# Patient Record
Sex: Male | Born: 1955 | Race: Black or African American | Hispanic: No | Marital: Single | Smoking: Former smoker
Health system: Southern US, Community
[De-identification: ages and names within clinical notes are randomized; demographics above are authoritative.]

## PROBLEM LIST (undated history)

## (undated) DIAGNOSIS — M5412 Radiculopathy, cervical region: Secondary | ICD-10-CM

## (undated) HISTORY — DX: Radiculopathy, cervical region: M54.12

---

## 1998-11-05 ENCOUNTER — Emergency Department (HOSPITAL_COMMUNITY): Admission: EM | Admit: 1998-11-05 | Discharge: 1998-11-05 | Payer: Self-pay | Admitting: Emergency Medicine

## 2014-09-23 ENCOUNTER — Ambulatory Visit
Admission: RE | Admit: 2014-09-23 | Discharge: 2014-09-23 | Disposition: A | Discharge status: Home / Self Care | Payer: No Typology Code available for payment source | Source: Ambulatory Visit | Attending: *Deleted | Admitting: *Deleted

## 2014-09-23 ENCOUNTER — Other Ambulatory Visit: Payer: Self-pay | Admitting: *Deleted

## 2014-09-23 DIAGNOSIS — N433 Hydrocele, unspecified: Secondary | ICD-10-CM

## 2018-11-19 ENCOUNTER — Emergency Department (HOSPITAL_COMMUNITY): Payer: Self-pay

## 2018-11-19 ENCOUNTER — Encounter (HOSPITAL_COMMUNITY): Payer: Self-pay | Admitting: *Deleted

## 2018-11-19 ENCOUNTER — Other Ambulatory Visit: Payer: Self-pay

## 2018-11-19 ENCOUNTER — Emergency Department (HOSPITAL_COMMUNITY)
Admission: EM | Admit: 2018-11-19 | Discharge: 2018-11-19 | Disposition: A | Discharge status: Home / Self Care | Payer: Self-pay | Attending: Emergency Medicine | Admitting: Emergency Medicine

## 2018-11-19 DIAGNOSIS — R109 Unspecified abdominal pain: Secondary | ICD-10-CM | POA: Insufficient documentation

## 2018-11-19 DIAGNOSIS — R3912 Poor urinary stream: Secondary | ICD-10-CM | POA: Insufficient documentation

## 2018-11-19 DIAGNOSIS — Z87891 Personal history of nicotine dependence: Secondary | ICD-10-CM | POA: Insufficient documentation

## 2018-11-19 DIAGNOSIS — R300 Dysuria: Secondary | ICD-10-CM | POA: Insufficient documentation

## 2018-11-19 LAB — CBC WITH DIFFERENTIAL/PLATELET
Abs Immature Granulocytes: 0.02 10*3/uL (ref 0.00–0.07)
Basophils Absolute: 0 10*3/uL (ref 0.0–0.1)
Basophils Relative: 0 %
Eosinophils Absolute: 0.1 10*3/uL (ref 0.0–0.5)
Eosinophils Relative: 1 %
HCT: 42.8 % (ref 39.0–52.0)
Hemoglobin: 13.1 g/dL (ref 13.0–17.0)
Immature Granulocytes: 0 %
Lymphocytes Relative: 23 %
Lymphs Abs: 1.2 10*3/uL (ref 0.7–4.0)
MCH: 28.8 pg (ref 26.0–34.0)
MCHC: 30.6 g/dL (ref 30.0–36.0)
MCV: 94.1 fL (ref 80.0–100.0)
Monocytes Absolute: 0.4 10*3/uL (ref 0.1–1.0)
Monocytes Relative: 8 %
Neutro Abs: 3.4 10*3/uL (ref 1.7–7.7)
Neutrophils Relative %: 68 %
Platelets: 198 10*3/uL (ref 150–400)
RBC: 4.55 MIL/uL (ref 4.22–5.81)
RDW: 12 % (ref 11.5–15.5)
WBC: 5.1 10*3/uL (ref 4.0–10.5)
nRBC: 0 % (ref 0.0–0.2)

## 2018-11-19 LAB — BASIC METABOLIC PANEL
Anion gap: 10 (ref 5–15)
BUN: 12 mg/dL (ref 8–23)
CO2: 24 mmol/L (ref 22–32)
Calcium: 9.6 mg/dL (ref 8.9–10.3)
Chloride: 105 mmol/L (ref 98–111)
Creatinine, Ser: 0.92 mg/dL (ref 0.61–1.24)
GFR calc Af Amer: >60 mL/min (ref >60)
GFR calc non Af Amer: >60 mL/min (ref >60)
Glucose, Bld: 118 mg/dL — ABNORMAL HIGH (ref 70–99)
Potassium: 4.4 mmol/L (ref 3.5–5.1)
Sodium: 139 mmol/L (ref 135–145)

## 2018-11-19 LAB — URINALYSIS, ROUTINE W REFLEX MICROSCOPIC
Bilirubin Urine: NEGATIVE
Glucose, UA: NEGATIVE mg/dL
Hgb urine dipstick: NEGATIVE
Ketones, ur: NEGATIVE mg/dL
Leukocytes,Ua: NEGATIVE
Nitrite: NEGATIVE
Protein, ur: NEGATIVE mg/dL
Specific Gravity, Urine: 1.012 (ref 1.005–1.030)
pH: 6 (ref 5.0–8.0)

## 2018-11-19 MED ORDER — CEPHALEXIN 500 MG PO CAPS: 500.0000 mg | ORAL_CAPSULE | Freq: Four times a day (QID) | ORAL | 0 refills | Status: DC

## 2018-11-19 MED ORDER — TAMSULOSIN HCL 0.4 MG PO CAPS: 0.4000 mg | ORAL_CAPSULE | Freq: Every day | ORAL | 0 refills | Status: AC

## 2018-11-19 NOTE — ED Provider Notes (Signed)
[facility] DEPT Provider Note   CSN: [id number] Arrival date & time: 11/19/18  1761  History   Chief Complaint Chief Complaint  Patient presents with   Dysuria   HPI Bryan George is a 63 y.o. male with past medical history presents for evaluation of dysuria.  Patient states over the past month he has had gradually worsening difficulty urinating.  Patient states he felt difficulty starting his stream and then his tremor stopped in the middle of him running.  He feels like he cannot completely [place] bladder.  Patient states he will intermittently have hematuria however is not had any over the last 3 days.  Patient states he will occasionally have left flank pain however does not have any currently.  No history of stones.  He denies any PCP.  Denies any history of prostate issues.  He does have history of right-sided hydrocele which had ultrasound for 3 years ago.  He denies fever, chills, nausea, vomiting, chest pain, shortness of breath, abdominal pain, diarrhea, penile rashes, lesions.  Has not take anything for symptoms PTA.  Denies any current pain.  Symptoms intermittent since onset.  History obtained from patient and past medical records.  No interpreter was used.     HPI  History reviewed. No pertinent past medical history.  There are no active problems to display for this patient.   History reviewed. No pertinent surgical history.      Home Medications    Prior to Admission medications   Medication Sig Start Date End Date Taking? Authorizing Provider  cephALEXin (KEFLEX) 500 MG capsule Take 1 capsule (500 mg total) by mouth 4 (four) times daily. 11/19/18   Genette Huertas A, PA-C  tamsulosin (FLOMAX) 0.4 MG CAPS capsule Take 1 capsule (0.4 mg total) by mouth daily after breakfast. 11/19/18   Yazlin Ekblad A, PA-C    Family History No family history on file.  Social History Social History   Tobacco Use   Smoking status: Former  Smoker   Smokeless tobacco: Never Used  Substance Use Topics   Alcohol use: Yes   Drug use: Not on file     Allergies   Patient has no allergy information on record.   Review of Systems Review of Systems  Constitutional: Negative.   HENT: Negative.   Respiratory: Negative.   Cardiovascular: Negative.   Gastrointestinal: Negative.   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 Negative for difficulty urinating, discharge, enuresis, flank pain, frequency (intermittently), genital sores, penile pain, penile swelling, testicular pain and urgency.  Musculoskeletal: Negative.   Skin: Negative.   Neurological: Negative.   All other systems reviewed and are negative.    Physical Exam Updated Vital Signs BP (!) 154/93 (BP Location: Left Arm)    Pulse 82    Temp 98.8 F (37.1 C) (Oral)    Resp 17    SpO2 100%   Physical Exam Vitals signs and nursing note reviewed. Exam conducted with a chaperone present.  Constitutional:      General: He is not in acute distress.    Appearance: He is not ill-appearing, toxic-appearing or diaphoretic.  HENT:     Head: Normocephalic and atraumatic.     Jaw: There is normal jaw occlusion.     Nose:     Comments: Clear rhinorrhea and congestion to bilateral nares.  No sinus tenderness.    Mouth/Throat:     Comments: Posterior oropharynx clear.  Mucous membranes moist.  Tonsils without erythema or exudate.  Uvula midline without deviation.  No evidence of PTA or RPA.  No drooling, dysphasia or trismus.  Phonation normal. Neck:     Trachea: Trachea and phonation normal.     Meningeal: Brudzinski's sign and Kernig's sign absent.     Comments: No Neck stiffness or neck rigidity.  No meningismus.  No cervical lymphadenopathy. Cardiovascular:     Pulses: Normal pulses.     Heart sounds: Normal heart sounds.     Comments: No murmurs rubs or  gallops. Pulmonary:     Effort: Pulmonary effort is normal.     Breath sounds: Normal breath sounds and air entry.     Comments: Clear to auscultation bilaterally without wheeze, rhonchi or rales.  No accessory muscle usage.  Able speak in full sentences. Abdominal:     General: Bowel sounds are normal.     Palpations: Abdomen is soft.     Tenderness: There is no abdominal tenderness. There is no right CVA tenderness, left CVA tenderness, guarding or rebound.     Hernia: There is no hernia in the left inguinal area or right inguinal area.     Comments: Soft, nontender without rebound or guarding.  No CVA tenderness.   Genitourinary:    Penis: Normal.      Scrotum/Testes: Normal. Cremasteric reflex is present.     Comments: Enlarged left testes without tenderness. No overlying skin changes Musculoskeletal:     Comments: Moves all 4 extremities without difficulty.  Lower extremities without edema, erythema or warmth.  Lymphadenopathy:     Lower Body: No right inguinal adenopathy. No left inguinal adenopathy.  Skin:    Comments: Brisk capillary refill.  No rashes or lesions.  Neurological:     Mental Status: He is alert.     Comments: Ambulatory in department without difficulty.  Cranial nerves II through XII grossly intact.  No facial droop.  No aphasia.      ED Treatments / Results  Labs (all labs ordered are listed, but only abnormal results are displayed) Labs Reviewed  BASIC METABOLIC PANEL - Abnormal; Notable for the following components:      Result Value   Glucose, Bld 118 (*)    All other components within normal limits  URINE CULTURE  URINALYSIS, ROUTINE W REFLEX MICROSCOPIC  CBC WITH DIFFERENTIAL/PLATELET    EKG None  Radiology Ct Renal Stone Study  Result Date: 11/19/2018 CLINICAL DATA:  Dysuria for several months. EXAM: CT ABDOMEN AND PELVIS WITHOUT CONTRAST TECHNIQUE: Multidetector CT imaging of the abdomen and pelvis was performed following the standard  protocol without IV contrast. COMPARISON:  None. FINDINGS: Lower chest: No acute abnormality. Hepatobiliary: 5 mm low density is identified in lateral liver, probably a cyst. The liver is otherwise unremarkable. The gallbladder and biliary tree are normal. Pancreas: Unremarkable. No pancreatic ductal dilatation or surrounding inflammatory changes. Spleen: Normal in size without focal abnormality. Adrenals/Urinary Tract: There is hypertrophy of left adrenal gland without focal lesion. The right adrenal gland is normal. No focal kidney lesion is identified. There is no kidney stones bilaterally. There is no hydronephrosis bilaterally. The bladder is normal. Stomach/Bowel: Stomach is within normal limits. Appendix appears normal. No evidence of bowel wall thickening, distention, or inflammatory changes. Vascular/Lymphatic: Aortic atherosclerosis. No enlarged abdominal or pelvic lymph nodes. Reproductive: Prostate calcification is identified. There is a large left hydrocele in the scrotum. Other: None. Musculoskeletal: Degenerative joint changes of the lower lumbar spine are noted.  IMPRESSION: No acute abnormality identified in the abdomen and pelvis. Large left hydrocele in the scrotum. Electronically Signed   By: Sherian ReinWei-Chen  Lin M.D.   On: 11/19/2018 11:45    Procedures Procedures (including critical care time)  Medications Ordered in ED Medications - No data to display  Initial Impression / Assessment and Plan / ED Course  I have reviewed the triage vital signs and the nursing notes.  Pertinent labs & imaging results that were available during my care of the patient were reviewed by me and considered in my medical decision making (see chart for details).  63 year old male appears otherwise well presents for evaluation of dysuria.  Afebrile, nonseptic, non-ill-appearing.  History of intermittent hematuria over the last month.  Patient states he has difficulty starting his stream as well has interruptions  in the middle of his stream.  He has no known prostate issues however does not have PCP follow-up.  Has occasionally had some flank pain however has none currently.  Denies abdominal pain, fever.  Has known left-sided hydrocele however states this has not changed since he was diagnosed 4 years ago.  He denies any testicular pain, increased swelling, rashes or lesions.  GU exam with a left-sided hydrocele without any tenderness.  Abdomen soft, nontender without rebound or guarding.  Negative CVA tap bilaterally.  Heart and lungs clear.  Intermittent hematuria, difficulty urinating as well as intermittent flank pain will obtain CT scan to assess for stone as well as urinalysis and check labs. Patient does not want repeat US of testes, he states he had this done previously.  Discussed risk versus benefit to include infection, blockage.  Patient voiced understanding however does not want repeat ultrasound at this time.    Labs and imaging personally reviewed CBC without leukocytosis Metabolic panel with mild hyperglycemia at 118, no additional electrolyte, renal liver abnormality, urinalysis negative for infection and blood CT stone protocol without evidence of stone, known left-sided hydrocele.  Calcifications in prostate.  Reevaluation patient without any current pain.  He is tolerating p.o. intake is ambulatory without difficulty.  He has been able to fully empty his bladder without any evidence of retention.  Question symptoms related to BPH.  His urinalysis was negative for infection however will send for culture.  Patient follow-up with urology however patient asking for symptomatic management at this time.  Will DC home with a short course of Flomax as well as Keflex which can then be de-escalate if the culture comes back negative given intermittent hematuria.  Low suspicion for prostatitis, testicular torsion, hernia, bowel obstruction/perforation.  The patient has been appropriately medically screened  and/or stabilized in the ED. I have low suspicion for any other emergent medical condition which would require further screening, evaluation or treatment in the ED or require inpatient management.  Patient is hemodynamically stable and in no acute distress.  Patient able to ambulate in department prior to ED.  Evaluation does not show acute pathology that would require ongoing or additional emergent interventions while in the emergency department or further inpatient treatment.  I have discussed the diagnosis with the patient and answered all questions.  Pain is been managed while in the emergency department and patient has no further complaints prior to discharge.  Patient is comfortable with plan discussed in room and is stable for discharge at this time.  I have discussed strict return precautions for returning to the emergency department.  Patient was encouraged to follow-up with PCP/specialist refer to at discharge.  Final Clinical Impressions(s) / ED Diagnoses   Final diagnoses:  Dysuria    ED Discharge Orders         Ordered    tamsulosin (FLOMAX) 0.4 MG CAPS capsule  Daily after breakfast     11/19/18 1321    cephALEXin (KEFLEX) 500 MG capsule  4 times daily     11/19/18 1321           Taje Littler A, PA-C 11/19/18 1322    Mancel BaleWentz, Elliott, MD 11/19/18 1808

## 2018-11-19 NOTE — ED Triage Notes (Signed)
Pt states he has been having difficulty urinating over the past few months. Pt states he will start to urinate and then suddenly stop. Pt will need to empty bladder but cannot. Pt states his urine is darker sometimes but otherwise normal. Pt denies history of prostate issues.

## 2018-11-19 NOTE — Discharge Instructions (Signed)
Take medicines as prescribed.  Please be careful with Flomax as this may make you lightheaded and dizzy.  When you go to get out of bed in the morning sit on the edge of the bed and dangle your feet.  Please do not jump up from a seated to a stand position as you may become dizzy.  Make sure to drink adequate fluids.  Follow-up with urology for reevaluation.  Return to the ED for any new or worsening symptoms.

## 2018-11-19 NOTE — ED Notes (Signed)
Urine culture sent to lab with UA sample 

## 2018-11-19 NOTE — ED Notes (Signed)
Bed: WTR6 Expected date:  Expected time:  Means of arrival:  Comments: 

## 2018-11-20 LAB — URINE CULTURE: Culture: NO GROWTH

## 2019-07-05 ENCOUNTER — Emergency Department (HOSPITAL_COMMUNITY): Payer: No Typology Code available for payment source

## 2019-07-05 ENCOUNTER — Emergency Department (HOSPITAL_COMMUNITY)
Admission: EM | Admit: 2019-07-05 | Discharge: 2019-07-05 | Disposition: A | Discharge status: Home / Self Care | Payer: No Typology Code available for payment source | Attending: Emergency Medicine | Admitting: Emergency Medicine

## 2019-07-05 ENCOUNTER — Other Ambulatory Visit: Payer: Self-pay

## 2019-07-05 ENCOUNTER — Encounter (HOSPITAL_COMMUNITY): Payer: Self-pay | Admitting: Emergency Medicine

## 2019-07-05 DIAGNOSIS — Z87891 Personal history of nicotine dependence: Secondary | ICD-10-CM | POA: Insufficient documentation

## 2019-07-05 DIAGNOSIS — I10 Essential (primary) hypertension: Secondary | ICD-10-CM

## 2019-07-05 DIAGNOSIS — Y999 Unspecified external cause status: Secondary | ICD-10-CM | POA: Insufficient documentation

## 2019-07-05 DIAGNOSIS — W19XXXA Unspecified fall, initial encounter: Secondary | ICD-10-CM | POA: Insufficient documentation

## 2019-07-05 DIAGNOSIS — Z79899 Other long term (current) drug therapy: Secondary | ICD-10-CM | POA: Insufficient documentation

## 2019-07-05 DIAGNOSIS — S4992XA Unspecified injury of left shoulder and upper arm, initial encounter: Secondary | ICD-10-CM | POA: Insufficient documentation

## 2019-07-05 DIAGNOSIS — Y939 Activity, unspecified: Secondary | ICD-10-CM | POA: Insufficient documentation

## 2019-07-05 DIAGNOSIS — Y929 Unspecified place or not applicable: Secondary | ICD-10-CM | POA: Insufficient documentation

## 2019-07-05 NOTE — ED Provider Notes (Signed)
[facility]-EMERGENCY DEPT Provider Note   CSN: [id number] Arrival date & time: 07/05/19  0813     History Chief Complaint  Patient presents with  . Shoulder Injury  . Fall    Bryan George is a 64 y.o. male.  HPI He complains of left shoulder pain following a fall, 2 days ago.  He denies injuries to head neck or back.  He denies shortness of breath, chest pain, weakness or dizziness.  No other concurrent illnesses.  He works as a Copy.  There are no other known modifying factors.    History reviewed. No pertinent past medical history.  There are no problems to display for this patient.   History reviewed. No pertinent surgical history.     No family history on file.  Social History   Tobacco Use  . Smoking status: Former Games developer  . Smokeless tobacco: Never Used  Substance Use Topics  . Alcohol use: Yes  . Drug use: Never    Home Medications Prior to Admission medications   Medication Sig Start Date End Date Taking? Authorizing Provider  cephALEXin (KEFLEX) 500 MG capsule Take 1 capsule (500 mg total) by mouth 4 (four) times daily. Patient not taking: Reported on 07/05/2019 11/19/18   Henderly, Britni A, PA-C  tamsulosin (FLOMAX) 0.4 MG CAPS capsule Take 1 capsule (0.4 mg total) by mouth daily after breakfast. 11/19/18   Henderly, Britni A, PA-C    Allergies    Patient has no known allergies.  Review of Systems   Review of Systems  All other systems reviewed and are negative.   Physical Exam Updated Vital Signs BP (!) 179/111 (BP Location: Right Arm)   Pulse 85   Temp 98.5 F (36.9 C) (Oral)   Resp 18   Ht 5\' 11"  (1.803 m)   Wt 99.8 kg   SpO2 99%   BMI 30.68 kg/m   Physical Exam Vitals and nursing note reviewed.  Constitutional:      General: He is not in acute distress.    Appearance: He is well-developed. He is not ill-appearing, toxic-appearing or diaphoretic.  HENT:     Head: Normocephalic and atraumatic.     Right  Ear: External ear normal.     Left Ear: External ear normal.  Eyes:     Conjunctiva/sclera: Conjunctivae normal.     Pupils: Pupils are equal, round, and reactive to light.  Neck:     Trachea: Phonation normal.  Cardiovascular:     Rate and Rhythm: Normal rate.  Pulmonary:     Effort: Pulmonary effort is normal. No respiratory distress.     Breath sounds: No stridor.  Chest:     Chest wall: No tenderness (No crepitation or deformity.).  Abdominal:     General: There is no distension.     Tenderness: There is no abdominal tenderness.  Musculoskeletal:        General: No swelling.     Cervical back: Normal range of motion and neck supple.     Comments: He guards against movement of the shoulder secondary to pain.  He is holding the left arm, with the elbow flexed, tightly against his chest.  There is no left shoulder deformity.  Skin:    General: Skin is warm and dry.  Neurological:     Mental Status: He is alert and oriented to person, place, and time.     Cranial Nerves: No cranial nerve deficit.     Motor: No abnormal muscle tone.  Coordination: Coordination normal.     Comments: He complains of mild dysesthesia of the fingers of the left hand.  Psychiatric:        Mood and Affect: Mood normal.        Behavior: Behavior normal.        Thought Content: Thought content normal.        Judgment: Judgment normal.     ED Results / Procedures / Treatments   Labs (all labs ordered are listed, but only abnormal results are displayed) Labs Reviewed - No data to display  EKG None  Radiology DG Shoulder Left  Result Date: 07/05/2019 CLINICAL DATA:  Injury after fall at home.  Left shoulder pain EXAM: LEFT SHOULDER - 2+ VIEW COMPARISON:  None. FINDINGS: There is no evidence of fracture or dislocation. Mild glenohumeral joint degenerative spurring. IMPRESSION: Negative for fracture or malalignment. Electronically Signed   By: Monte Fantasia M.D.   On: 07/05/2019 08:54     Procedures Procedures (including critical care time)  Medications Ordered in ED Medications - No data to display  ED Course  I have reviewed the triage vital signs and the nursing notes.  Pertinent labs & imaging results that were available during my care of the patient were reviewed by me and considered in my medical decision making (see chart for details).  Clinical Course as of Jul 04 926  Sun Jul 05, 2019  0857 No fracture or dislocation, interpreted by me  DG Shoulder Left [EW]    Clinical Course User Index [EW] Daleen Bo, MD   MDM Rules/Calculators/A&P                       Patient Vitals for the past 24 hrs:  BP Temp Temp src Pulse Resp SpO2 Height Weight  07/05/19 0827 (!) 179/111 98.5 F (36.9 C) Oral 85 18 99 % 5\' 11"  (1.803 m) 99.8 kg    9:28 AM Reevaluation with update and discussion. After initial assessment and treatment, an updated evaluation reveals no change in clinical status, findings discussed and questions answered. Daleen Bo   Medical Decision Making: Left shoulder injury, subacute, with persistent pain, reassuring x-rays.  Doubt shoulder dislocation with spontaneous relocation.  Doubt fracture.  Doubt cervical myelopathy.  I suspect the mild disease of left hand is because he is holding the left arm, against his chest without moving it much.  He is placed in a arm sling in the ED, to help him relax, which will likely improve his dysphagia.  He is given injury care instructions, to primarily use sling for comfort and heating pad for relief of the discomfort.  Analgesic can be over-the-counter with Motrin or Tylenol.  Incidental mild hypertension present, patient encouraged to follow-up as an outpatient for repeat testing in a week or 2.  Bryan George was evaluated in Emergency Department on 07/05/2019 for the symptoms described in the history of present illness. He was evaluated in the context of the global COVID-19 pandemic, which necessitated  consideration that the patient might be at risk for infection with the SARS-CoV-2 virus that causes COVID-19. Institutional protocols and algorithms that pertain to the evaluation of patients at risk for COVID-19 are in a state of rapid change based on information released by regulatory bodies including the CDC and federal and state organizations. These policies and algorithms were followed during the patient's care in the ED.   CRITICAL CARE-no Performed by: Daleen Bo   Nursing Notes Reviewed/ Care  Coordinated Applicable Imaging Reviewed Interpretation of Laboratory Data incorporated into ED treatment  The patient appears reasonably screened and/or stabilized for discharge and I doubt any other medical condition or other [facility] requiring further screening, evaluation, or treatment in the ED at this time prior to discharge.  Plan: Home Medications-continue usual medications and use OTC analgesia of choice; Home Treatments-left arm sling, heat applications for comfort; return here if the recommended treatment, does not improve the symptoms; Recommended follow up-PCP,prn    Final Clinical Impression(s) / ED Diagnoses Final diagnoses:  Injury of left shoulder, initial encounter  Hypertension, unspecified type    Rx / DC Orders ED Discharge Orders    None       Mancel Bale, MD 07/05/19 [phone]

## 2019-07-05 NOTE — ED Triage Notes (Signed)
Patient here from home reporting left shoulder injury. States that he tripped and fell on Friday and "still has pain". Deformity noted.

## 2019-07-05 NOTE — Discharge Instructions (Addendum)
There is no serious injury to your left shoulder.  We are providing an arm sling to help your discomfort.  Use it as needed to help relax your left arm.  Also, using heat on the sore area either with a heating pad or warm compress can be helpful for the discomfort.  For pain use Motrin or Tylenol.  Avoid lifting or working with the left hand as much as possible.  Follow-up with a primary care doctor if you continue to have pain after 2 or 3 days.  Your blood pressure was slightly elevated when we checked it.  For now, try to avoid salt in your diet, to improve your blood pressure.  Follow-up with a primary care doctor for a blood pressure check in 1 or 2 weeks.  You can use the resource guide, if needed, to help you find a doctor.

## 2019-11-18 ENCOUNTER — Emergency Department (HOSPITAL_COMMUNITY)
Admission: EM | Admit: 2019-11-18 | Discharge: 2019-11-18 | Disposition: A | Discharge status: Home / Self Care | Payer: Self-pay | Attending: Emergency Medicine | Admitting: Emergency Medicine

## 2019-11-18 ENCOUNTER — Encounter (HOSPITAL_COMMUNITY): Payer: Self-pay | Admitting: Emergency Medicine

## 2019-11-18 ENCOUNTER — Emergency Department (HOSPITAL_COMMUNITY): Payer: Self-pay

## 2019-11-18 ENCOUNTER — Other Ambulatory Visit: Payer: Self-pay

## 2019-11-18 DIAGNOSIS — M5412 Radiculopathy, cervical region: Secondary | ICD-10-CM

## 2019-11-18 DIAGNOSIS — M5030 Other cervical disc degeneration, unspecified cervical region: Secondary | ICD-10-CM

## 2019-11-18 DIAGNOSIS — Z87891 Personal history of nicotine dependence: Secondary | ICD-10-CM | POA: Insufficient documentation

## 2019-11-18 MED ORDER — PREDNISONE 10 MG (21) PO TBPK: ORAL_TABLET | ORAL | 0 refills | Status: AC

## 2019-11-18 MED ORDER — DEXAMETHASONE SODIUM PHOSPHATE 10 MG/ML IJ SOLN
10.0000 mg | Freq: Once | INTRAMUSCULAR | Status: AC
  Administered 2019-11-18: 10 mg via INTRAMUSCULAR
  Filled 2019-11-18: qty 1

## 2019-11-18 MED ORDER — MELOXICAM 15 MG PO TABS: 15.0000 mg | ORAL_TABLET | Freq: Every day | ORAL | 0 refills | Status: DC

## 2019-11-18 MED ORDER — KETOROLAC TROMETHAMINE 30 MG/ML IJ SOLN
30.0000 mg | Freq: Once | INTRAMUSCULAR | Status: AC
  Administered 2019-11-18: 30 mg via INTRAMUSCULAR
  Filled 2019-11-18: qty 1

## 2019-11-18 NOTE — ED Triage Notes (Signed)
Pt reports was seen here month ago for shoulder pain/numbness, now moved down his arm to his left hand. Reports he lost his paperwork from when was seen here where to follow up at.

## 2019-11-18 NOTE — ED Provider Notes (Signed)
[facility]-EMERGENCY DEPT Provider Note   CSN: [id number] Arrival date & time: 11/18/19  7829     History Chief Complaint  Patient presents with   hand numbness    Bryan George is a 64 y.o. male.  Pt presents to the ED today with left hand numbness.  The pt was seen here about a month ago for shoulder pain.  He lost the paperwork for f/u.  He does not have a pcp.  He said he's had some left hand pain and numbness for the past month.  He said he has had some difficulty squeezing his hand.        History reviewed. No pertinent past medical history.  There are no problems to display for this patient.   History reviewed. No pertinent surgical history.     No family history on file.  Social History   Tobacco Use   Smoking status: Former Smoker   Smokeless tobacco: Never Used  Substance Use Topics   Alcohol use: Yes   Drug use: Never    Home Medications Prior to Admission medications   Medication Sig Start Date End Date Taking? Authorizing Provider  cephALEXin (KEFLEX) 500 MG capsule Take 1 capsule (500 mg total) by mouth 4 (four) times daily. Patient not taking: Reported on 07/05/2019 11/19/18   Henderly, Britni A, PA-C  meloxicam (MOBIC) 15 MG tablet Take 1 tablet (15 mg total) by mouth daily. 11/18/19   Jacalyn Lefevre, MD  predniSONE (STERAPRED UNI-PAK 21 TAB) 10 MG (21) TBPK tablet Take 6 tabs for 2 days, then 5 for 2 days, then 4 for 2 days, then 3 for 2 days, 2 for 2 days, then 1 for 2 days 11/18/19   Jacalyn Lefevre, MD  tamsulosin [facility]) 0.4 MG CAPS capsule Take 1 capsule (0.4 mg total) by mouth daily after breakfast. 11/19/18   Henderly, Britni A, PA-C    Allergies    Patient has no known allergies.  Review of Systems   Review of Systems  Neurological: Positive for numbness.  All other systems reviewed and are negative.   Physical Exam Updated Vital Signs BP (!) 152/92 (BP Location: Right Arm)    Pulse 62    Temp 98 F (36.7  C) (Oral)    Resp 17    SpO2 100%   Physical Exam Vitals and nursing note reviewed.  Constitutional:      Appearance: Normal appearance.  HENT:     Head: Normocephalic and atraumatic.     Right Ear: External ear normal.     Left Ear: External ear normal.     Nose: Nose normal.     Mouth/Throat:     Mouth: Mucous membranes are moist.     Pharynx: Oropharynx is clear.  Eyes:     Extraocular Movements: Extraocular movements intact.     Conjunctiva/sclera: Conjunctivae normal.     Pupils: Pupils are equal, round, and reactive to light.  Cardiovascular:     Rate and Rhythm: Normal rate and regular rhythm.     Pulses: Normal pulses.     Heart sounds: Normal heart sounds.  Pulmonary:     Effort: Pulmonary effort is normal.     Breath sounds: Normal breath sounds.  Abdominal:     General: Abdomen is flat. Bowel sounds are normal.     Palpations: Abdomen is soft.  Musculoskeletal:        General: Normal range of motion.     Cervical back: Normal range of  motion and neck supple.  Skin:    General: Skin is warm.     Capillary Refill: Capillary refill takes less than 2 seconds.  Neurological:     Mental Status: He is alert and oriented to person, place, and time.     Comments: Left hand subjective numbness.  Psychiatric:        Mood and Affect: Mood normal.        Behavior: Behavior normal.     ED Results / Procedures / Treatments   Labs (all labs ordered are listed, but only abnormal results are displayed) Labs Reviewed - No data to display  EKG None  Radiology CT Cervical Spine Wo Contrast  Result Date: 11/18/2019 CLINICAL DATA:  Left-sided cervical radiculopathy. EXAM: CT CERVICAL SPINE WITHOUT CONTRAST TECHNIQUE: Multidetector CT imaging of the cervical spine was performed without intravenous contrast. Multiplanar CT image reconstructions were also generated. COMPARISON:  None. FINDINGS: Alignment: Normal. Skull base and vertebrae: No acute fracture. No primary bone  lesion or focal pathologic process. Soft tissues and spinal canal: No prevertebral fluid or swelling. No visible canal hematoma. Disc levels: Severe degenerative disc disease is noted at C3-4 and C4-5 with anterior posterior osteophyte formation. Moderate degenerative disc disease is noted at C2-3, C5-6 and C6-7. Upper chest: Negative. Other: None. IMPRESSION: Severe multilevel degenerative disc disease. No acute abnormality seen in the cervical spine. Electronically Signed   By: Lupita Raider M.D.   On: 11/18/2019 09:45    Procedures Procedures (including critical care time)  Medications Ordered in ED Medications  dexamethasone (DECADRON) injection 10 mg (10 mg Intramuscular Given 11/18/19 0941)  ketorolac (TORADOL) 30 MG/ML injection 30 mg (30 mg Intramuscular Given 11/18/19 0941)    ED Course  I have reviewed the triage vital signs and the nursing notes.  Pertinent labs & imaging results that were available during my care of the patient were reviewed by me and considered in my medical decision making (see chart for details).    MDM Rules/Calculators/A&P                          Pt does not have a pcp, so is encouraged to establish care with one.  Pain has improved after treatment.  Pt is told to f/u with NS and with pcp.  Return if worse. Final Clinical Impression(s) / ED Diagnoses Final diagnoses:  Cervical radiculopathy  Degenerative cervical disc    Rx / DC Orders ED Discharge Orders         Ordered    predniSONE (STERAPRED UNI-PAK 21 TAB) 10 MG (21) TBPK tablet     Discontinue  Reprint     11/18/19 0958    meloxicam (MOBIC) 15 MG tablet  Daily     Discontinue  Reprint     11/18/19 0958           Jacalyn Lefevre, MD 11/18/19 [phone]

## 2019-12-11 ENCOUNTER — Encounter: Payer: Self-pay | Admitting: Nurse Practitioner

## 2019-12-11 ENCOUNTER — Ambulatory Visit: Payer: No Typology Code available for payment source | Attending: Nurse Practitioner | Admitting: Nurse Practitioner

## 2019-12-11 ENCOUNTER — Other Ambulatory Visit: Payer: Self-pay

## 2019-12-11 VITALS — Ht 71.0 in | Wt 210.0 lb

## 2019-12-11 DIAGNOSIS — Z7689 Persons encountering health services in other specified circumstances: Secondary | ICD-10-CM

## 2019-12-11 DIAGNOSIS — M5412 Radiculopathy, cervical region: Secondary | ICD-10-CM

## 2019-12-11 MED ORDER — TIZANIDINE HCL 4 MG PO TABS: 4.0000 mg | ORAL_TABLET | Freq: Three times a day (TID) | ORAL | 1 refills | Status: AC | PRN

## 2019-12-11 MED ORDER — MELOXICAM 15 MG PO TABS: 15.0000 mg | ORAL_TABLET | Freq: Every day | ORAL | 1 refills | Status: AC

## 2019-12-11 NOTE — Progress Notes (Signed)
Virtual Visit via Telephone Note Due to national recommendations of social distancing due to COVID 19, telehealth visit is felt to be most appropriate for this patient at this time.  I discussed the limitations, risks, security and privacy concerns of performing an evaluation and management service by telephone and the availability of in person appointments. I also discussed with the patient that there may be a patient responsible charge related to this service. The patient expressed understanding and agreed to proceed.    I connected with Bryan George on 12/11/19  at   9:50 AM EDT  EDT by telephone and verified that I am speaking with the correct person using two identifiers.   Consent I discussed the limitations, risks, security and privacy concerns of performing an evaluation and management service by telephone and the availability of in person appointments. I also discussed with the patient that there may be a patient responsible charge related to this service. The patient expressed understanding and agreed to proceed.   Location of Patient: Private Residence   Location of Provider: [facility]    Persons participating in Telemedicine visit: Bryan Denver FNP-BC YY [place] CMA Bryan George    History of Present Illness: Telemedicine visit for: Establish Care  has no significant past medical history on file.  Currently uninsured. Patient has been advised to apply for financial assistance and schedule to see our financial counselor.  He has a history of cervical radiculopathy. Will need to see a spine specialist. Reports left hand numbness with inability to close his fist due to decreased sensation and right hand numbness and tingling worse between thumb and index finger. He has neck pain and left shoulder pain. His shoulder pain was sustained after a fall 5 months ago. He is currently taking prednisone and meloxicam. Will add zanaflex and gabapentin at  this time.  CT CERVICAL SPINE 11-2019 Severe multilevel degenerative disc disease. No acute abnormality seen in the cervical spine.  Past Medical History:  Diagnosis Date  . Cervical radiculopathy     Past Surgical History:  Procedure Laterality Date  . NO PAST SURGERIES      Family History  Problem Relation Age of Onset  . Hypertension Neg Hx     Social History   Socioeconomic History  . Marital status: Single    Spouse name: Not on file  . Number of children: Not on file  . Years of education: Not on file  . Highest education level: Not on file  Occupational History  . Not on file  Tobacco Use  . Smoking status: Former Games developer  . Smokeless tobacco: Never Used  Substance and Sexual Activity  . Alcohol use: Yes  . Drug use: Never  . Sexual activity: Not Currently  Other Topics Concern  . Not on file  Social History Narrative  . Not on file   Social Determinants of Health   Financial Resource Strain:   . Difficulty of Paying Living Expenses:   Food Insecurity:   . Worried About Programme researcher, broadcasting/film/video in the Last Year:   . Barista in the Last Year:   Transportation Needs:   . Freight forwarder (Medical):   Marland Kitchen Lack of Transportation (Non-Medical):   Physical Activity:   . Days of Exercise per Week:   . Minutes of Exercise per Session:   Stress:   . Feeling of Stress :   Social Connections:   . Frequency of Communication with Friends and Family:   .  Frequency of Social Gatherings with Friends and Family:   . Attends Religious Services:   . Active Member of Clubs or Organizations:   . Attends Banker Meetings:   Marland Kitchen Marital Status:      Observations/Objective: Awake, alert and oriented x 3   Review of Systems  Constitutional: Negative for fever, malaise/fatigue and weight loss.  HENT: Negative.  Negative for nosebleeds.   Eyes: Negative.  Negative for blurred vision, double vision and photophobia.  Respiratory: Negative.  Negative  for cough and shortness of breath.   Cardiovascular: Negative.  Negative for chest pain, palpitations and leg swelling.  Gastrointestinal: Negative.  Negative for heartburn, nausea and vomiting.  Musculoskeletal: Positive for neck pain. Negative for myalgias.  Neurological: Positive for tingling and sensory change. Negative for dizziness, focal weakness, seizures and headaches.  Psychiatric/Behavioral: Negative.  Negative for suicidal ideas.    Assessment and Plan: [name] was seen today for hospitalization follow-up.  Diagnoses and all orders for this visit:  Encounter to establish care  Cervical radiculopathy -     meloxicam (MOBIC) 15 MG tablet; Take 1 tablet (15 mg total) by mouth daily. -     tiZANidine (ZANAFLEX) 4 MG tablet; Take 1 tablet (4 mg total) by mouth every 8 (eight) hours as needed for muscle spasms. -     gabapentin (NEURONTIN) 300 MG capsule; Take 1 capsule (300 mg total) by mouth 3 (three) times daily. -     Ambulatory referral to Orthopedic Surgery     Follow Up Instructions Return in about 6 weeks (around 01/22/2020).     I discussed the assessment and treatment plan with the patient. The patient was provided an opportunity to ask questions and all were answered. The patient agreed with the plan and demonstrated an understanding of the instructions.   The patient was advised to call back or seek an in-person evaluation if the symptoms worsen or if the condition fails to improve as anticipated.  I provided 19 minutes of non-face-to-face time during this encounter including median intraservice time, reviewing previous notes, labs, imaging, medications and explaining diagnosis and management.  Claiborne Rigg, FNP-BC

## 2019-12-13 ENCOUNTER — Encounter: Payer: Self-pay | Admitting: Nurse Practitioner

## 2019-12-13 MED ORDER — GABAPENTIN 300 MG PO CAPS: 300.0000 mg | ORAL_CAPSULE | Freq: Three times a day (TID) | ORAL | 3 refills | Status: AC

## 2019-12-15 ENCOUNTER — Other Ambulatory Visit: Payer: Self-pay

## 2019-12-15 ENCOUNTER — Other Ambulatory Visit: Payer: Self-pay | Admitting: Nurse Practitioner

## 2019-12-15 ENCOUNTER — Ambulatory Visit: Payer: Self-pay | Attending: Nurse Practitioner

## 2019-12-15 DIAGNOSIS — Z130 Encounter for screening for diseases of the blood and blood-forming organs and certain disorders involving the immune mechanism: Secondary | ICD-10-CM

## 2019-12-15 DIAGNOSIS — Z125 Encounter for screening for malignant neoplasm of prostate: Secondary | ICD-10-CM

## 2019-12-15 DIAGNOSIS — Z13228 Encounter for screening for other metabolic disorders: Secondary | ICD-10-CM

## 2019-12-15 DIAGNOSIS — Z1329 Encounter for screening for other suspected endocrine disorder: Secondary | ICD-10-CM

## 2019-12-15 DIAGNOSIS — E559 Vitamin D deficiency, unspecified: Secondary | ICD-10-CM

## 2019-12-15 DIAGNOSIS — Z131 Encounter for screening for diabetes mellitus: Secondary | ICD-10-CM

## 2019-12-15 DIAGNOSIS — Z13220 Encounter for screening for lipoid disorders: Secondary | ICD-10-CM

## 2019-12-16 LAB — HEMOGLOBIN A1C
Est. average glucose Bld gHb Est-mCnc: 108 mg/dL
Hgb A1c MFr Bld: 5.4 % (ref 4.8–5.6)

## 2019-12-16 LAB — LIPID PANEL
Chol/HDL Ratio: 4.4 ratio (ref 0.0–5.0)
Cholesterol, Total: 181 mg/dL (ref 100–199)
HDL: 41 mg/dL (ref >39)
LDL Chol Calc (NIH): 120 mg/dL — ABNORMAL HIGH (ref 0–99)
Triglycerides: 109 mg/dL (ref 0–149)
VLDL Cholesterol Cal: 20 mg/dL (ref 5–40)

## 2019-12-16 LAB — CBC
Hematocrit: 34.1 % — ABNORMAL LOW (ref 37.5–51.0)
Hemoglobin: 11.2 g/dL — ABNORMAL LOW (ref 13.0–17.7)
MCH: 29.1 pg (ref 26.6–33.0)
MCHC: 32.8 g/dL (ref 31.5–35.7)
MCV: 89 fL (ref 79–97)
Platelets: 208 10*3/uL (ref 150–450)
RBC: 3.85 x10E6/uL — ABNORMAL LOW (ref 4.14–5.80)
RDW: 11.3 % — ABNORMAL LOW (ref 11.6–15.4)
WBC: 3.6 10*3/uL (ref 3.4–10.8)

## 2019-12-16 LAB — CMP14+EGFR
ALT: 31 IU/L (ref 0–44)
AST: 29 IU/L (ref 0–40)
Albumin/Globulin Ratio: 1.9 (ref 1.2–2.2)
Albumin: 4.4 g/dL (ref 3.8–4.8)
Alkaline Phosphatase: 88 IU/L (ref 48–121)
BUN/Creatinine Ratio: 12 (ref 10–24)
BUN: 12 mg/dL (ref 8–27)
Bilirubin Total: 0.3 mg/dL (ref 0.0–1.2)
CO2: 24 mmol/L (ref 20–29)
Calcium: 9.4 mg/dL (ref 8.6–10.2)
Chloride: 104 mmol/L (ref 96–106)
Creatinine, Ser: 0.97 mg/dL (ref 0.76–1.27)
GFR calc Af Amer: 94 mL/min/{1.73_m2} (ref >59)
GFR calc non Af Amer: 82 mL/min/{1.73_m2} (ref >59)
Globulin, Total: 2.3 g/dL (ref 1.5–4.5)
Glucose: 98 mg/dL (ref 65–99)
Potassium: 4.5 mmol/L (ref 3.5–5.2)
Sodium: 143 mmol/L (ref 134–144)
Total Protein: 6.7 g/dL (ref 6.0–8.5)

## 2019-12-16 LAB — PSA: Prostate Specific Ag, Serum: 4.7 ng/mL — ABNORMAL HIGH (ref 0.0–4.0)

## 2019-12-16 LAB — TSH: TSH: 0.378 u[IU]/mL — ABNORMAL LOW (ref 0.450–4.500)

## 2019-12-16 LAB — VITAMIN D 25 HYDROXY (VIT D DEFICIENCY, FRACTURES): Vit D, 25-Hydroxy: 28.9 ng/mL — ABNORMAL LOW (ref 30.0–100.0)

## 2019-12-20 ENCOUNTER — Other Ambulatory Visit: Payer: Self-pay | Admitting: Nurse Practitioner

## 2019-12-20 DIAGNOSIS — Z1211 Encounter for screening for malignant neoplasm of colon: Secondary | ICD-10-CM

## 2019-12-23 ENCOUNTER — Ambulatory Visit: Payer: No Typology Code available for payment source | Admitting: Family Medicine

## 2019-12-30 ENCOUNTER — Ambulatory Visit: Payer: No Typology Code available for payment source | Admitting: Nurse Practitioner

## 2020-01-26 ENCOUNTER — Other Ambulatory Visit: Payer: Self-pay

## 2020-01-26 ENCOUNTER — Ambulatory Visit: Payer: Self-pay | Attending: Family Medicine

## 2020-01-26 ENCOUNTER — Other Ambulatory Visit: Payer: Self-pay | Admitting: Nurse Practitioner

## 2020-01-26 DIAGNOSIS — R7989 Other specified abnormal findings of blood chemistry: Secondary | ICD-10-CM

## 2020-01-27 LAB — THYROID PANEL WITH TSH
Free Thyroxine Index: 1.1 — ABNORMAL LOW (ref 1.2–4.9)
T3 Uptake Ratio: 24 % (ref 24–39)
T4, Total: 4.7 ug/dL (ref 4.5–12.0)
TSH: 0.925 u[IU]/mL (ref 0.450–4.500)

## 2020-02-02 ENCOUNTER — Ambulatory Visit: Payer: No Typology Code available for payment source | Admitting: Nurse Practitioner

## 2020-02-02 ENCOUNTER — Encounter: Payer: Self-pay | Admitting: *Deleted

## 2021-12-05 IMAGING — CR DG SHOULDER 2+V*L*
2 series · 2 of 2 positions shown · non-contrast
Comparison: None.

CLINICAL DATA: Injury after fall at home.  Left shoulder pain

EXAM:
LEFT SHOULDER - 2+ VIEW

[w shoulder external left]
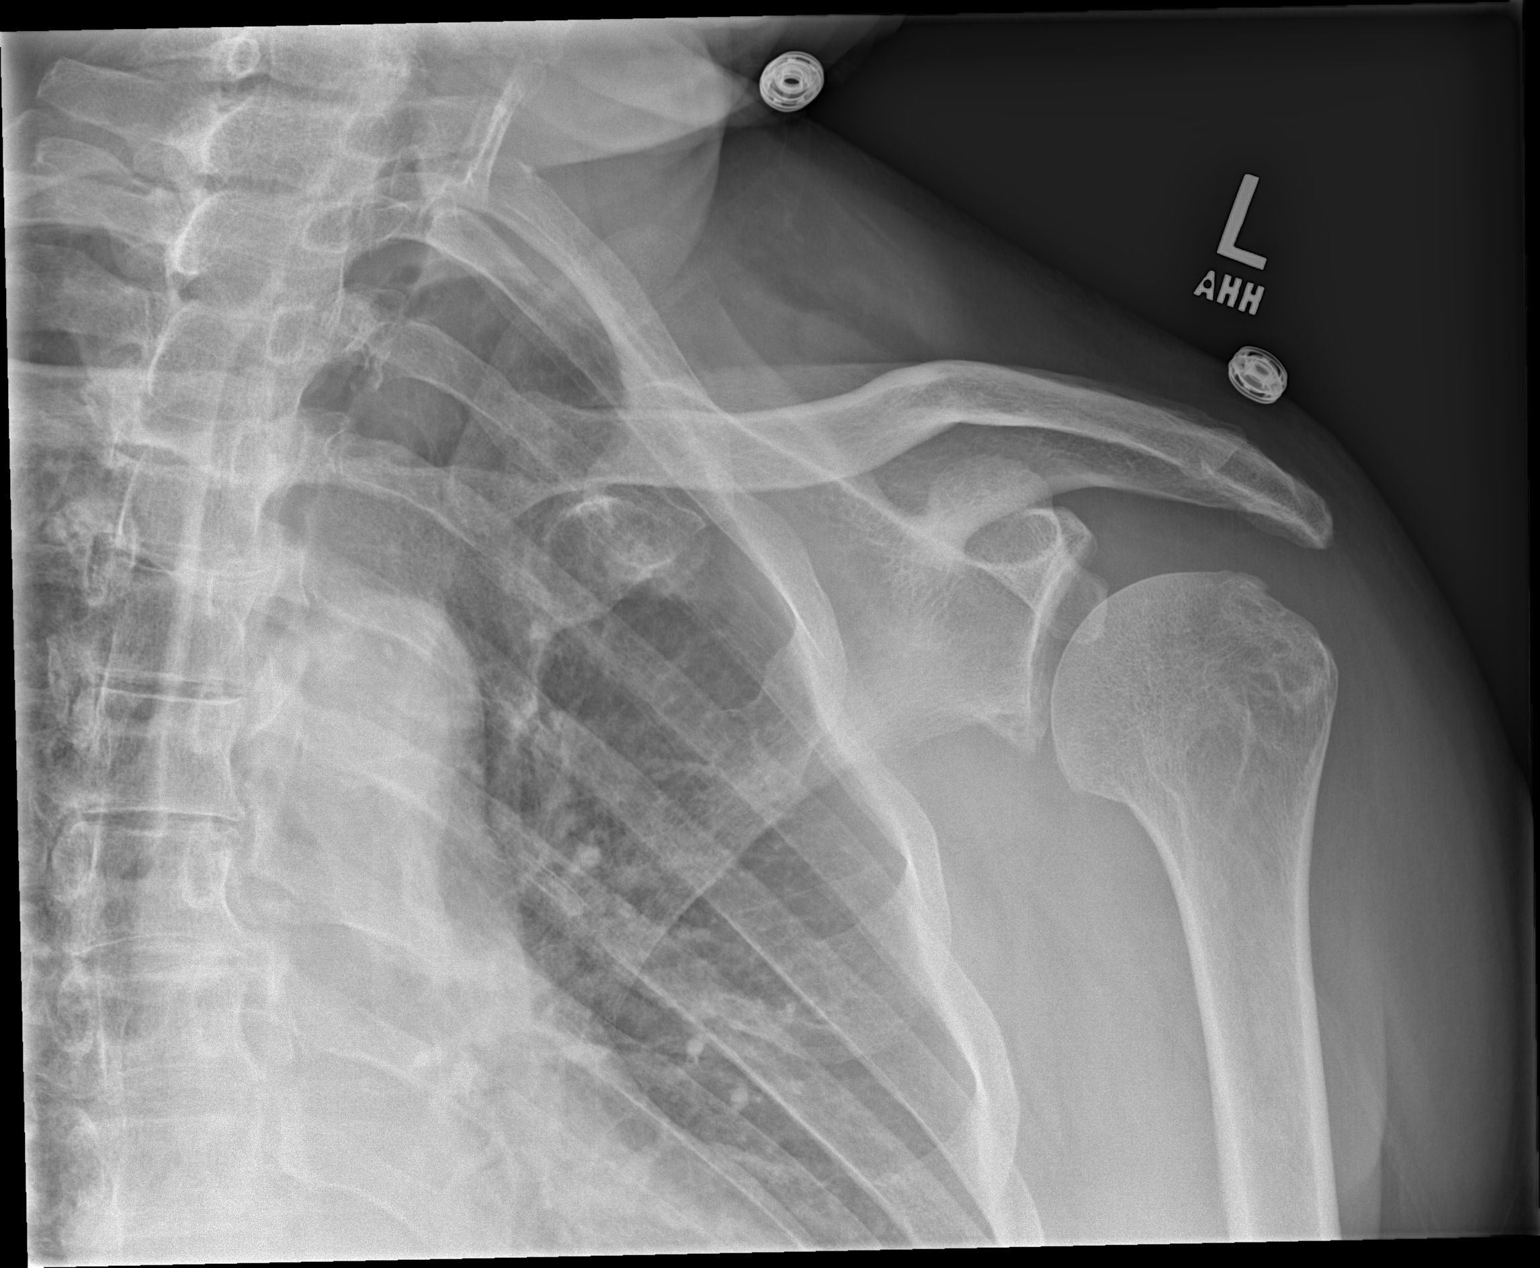

[w shoulder y-view left]
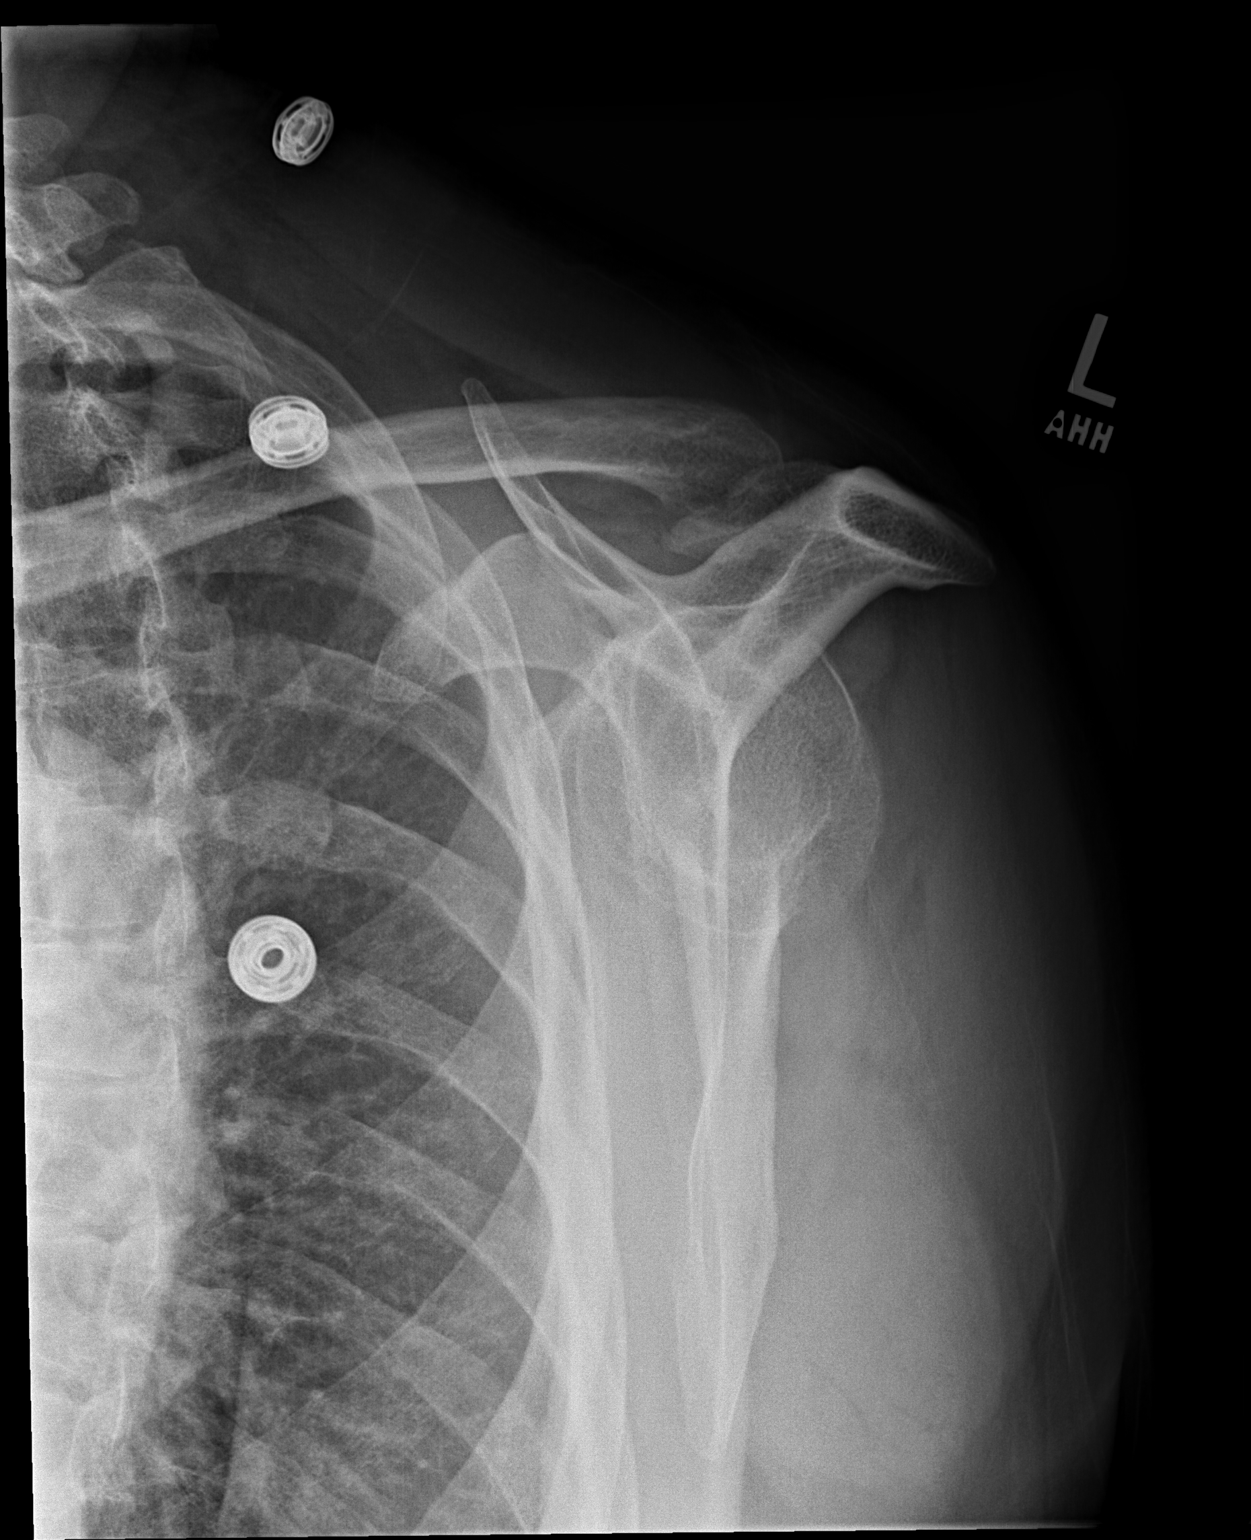

[2 of 2 positions shown; findings below may reference images not displayed]

FINDINGS: There is no evidence of fracture or dislocation. Mild glenohumeral
joint degenerative spurring.
IMPRESSION: Negative for fracture or malalignment.
